# Patient Record
Sex: Female | Born: 1940 | Race: Black or African American | Hispanic: No | Marital: Married | Smoking: Never smoker
Health system: Southern US, Community
[De-identification: ages and names within clinical notes are randomized; demographics above are authoritative.]

## PROBLEM LIST (undated history)

## (undated) DIAGNOSIS — I509 Heart failure, unspecified: Secondary | ICD-10-CM

## (undated) DIAGNOSIS — I10 Essential (primary) hypertension: Secondary | ICD-10-CM

## (undated) DIAGNOSIS — E119 Type 2 diabetes mellitus without complications: Secondary | ICD-10-CM

## (undated) DIAGNOSIS — I219 Acute myocardial infarction, unspecified: Secondary | ICD-10-CM

## (undated) HISTORY — PX: ABDOMINAL HYSTERECTOMY: SHX81

---

## 1998-12-20 ENCOUNTER — Encounter: Payer: Self-pay | Admitting: Ophthalmology

## 1998-12-20 ENCOUNTER — Ambulatory Visit (HOSPITAL_COMMUNITY): Admission: RE | Admit: 1998-12-20 | Discharge: 1998-12-21 | Payer: Self-pay | Admitting: Ophthalmology

## 2006-05-27 ENCOUNTER — Emergency Department (HOSPITAL_COMMUNITY): Admission: EM | Admit: 2006-05-27 | Discharge: 2006-05-27 | Payer: Self-pay | Admitting: Emergency Medicine

## 2017-11-02 ENCOUNTER — Other Ambulatory Visit: Payer: Self-pay

## 2017-11-02 ENCOUNTER — Emergency Department (HOSPITAL_COMMUNITY): Payer: Medicare Other

## 2017-11-02 ENCOUNTER — Encounter (HOSPITAL_COMMUNITY): Payer: Self-pay

## 2017-11-02 DIAGNOSIS — Z79899 Other long term (current) drug therapy: Secondary | ICD-10-CM | POA: Insufficient documentation

## 2017-11-02 DIAGNOSIS — E119 Type 2 diabetes mellitus without complications: Secondary | ICD-10-CM | POA: Diagnosis not present

## 2017-11-02 DIAGNOSIS — I110 Hypertensive heart disease with heart failure: Secondary | ICD-10-CM | POA: Diagnosis not present

## 2017-11-02 DIAGNOSIS — R0602 Shortness of breath: Secondary | ICD-10-CM | POA: Diagnosis not present

## 2017-11-02 DIAGNOSIS — I509 Heart failure, unspecified: Secondary | ICD-10-CM | POA: Insufficient documentation

## 2017-11-02 DIAGNOSIS — Z7982 Long term (current) use of aspirin: Secondary | ICD-10-CM | POA: Insufficient documentation

## 2017-11-02 DIAGNOSIS — Z7984 Long term (current) use of oral hypoglycemic drugs: Secondary | ICD-10-CM | POA: Diagnosis not present

## 2017-11-02 LAB — BASIC METABOLIC PANEL
ANION GAP: 14 (ref 5–15)
BUN: 8 mg/dL (ref 6–20)
CO2: 26 mmol/L (ref 22–32)
Calcium: 9.6 mg/dL (ref 8.9–10.3)
Chloride: 99 mmol/L — ABNORMAL LOW (ref 101–111)
Creatinine, Ser: 0.95 mg/dL (ref 0.44–1.00)
GFR calc Af Amer: >60 mL/min (ref >60)
GFR calc non Af Amer: 58 mL/min — ABNORMAL LOW (ref >60)
GLUCOSE: 128 mg/dL — AB (ref 65–99)
POTASSIUM: 3.7 mmol/L (ref 3.5–5.1)
Sodium: 139 mmol/L (ref 135–145)

## 2017-11-02 LAB — CBC
HEMATOCRIT: 33.6 % — AB (ref 36.0–46.0)
HEMOGLOBIN: 11.3 g/dL — AB (ref 12.0–15.0)
MCH: 30.3 pg (ref 26.0–34.0)
MCHC: 33.6 g/dL (ref 30.0–36.0)
MCV: 90.1 fL (ref 78.0–100.0)
Platelets: 257 10*3/uL (ref 150–400)
RBC: 3.73 MIL/uL — ABNORMAL LOW (ref 3.87–5.11)
RDW: 13.1 % (ref 11.5–15.5)
WBC: 12 10*3/uL — ABNORMAL HIGH (ref 4.0–10.5)

## 2017-11-02 LAB — I-STAT TROPONIN, ED: Troponin i, poc: 0.05 ng/mL (ref 0.00–0.08)

## 2017-11-02 MED ORDER — IBUPROFEN 400 MG PO TABS
400.0000 mg | ORAL_TABLET | Freq: Once | ORAL | Status: AC | PRN
  Administered 2017-11-02: 400 mg via ORAL
  Filled 2017-11-02: qty 1

## 2017-11-02 NOTE — ED Triage Notes (Signed)
Per Pt, Pt is coming from home with complaints of SOB and cough that started yesterday. Denies any chest pain or congestion. No productivity with the cough.

## 2017-11-02 NOTE — ED Notes (Signed)
Pt requesting something for a headache. 

## 2017-11-03 ENCOUNTER — Emergency Department (HOSPITAL_COMMUNITY)
Admission: EM | Admit: 2017-11-03 | Discharge: 2017-11-03 | Disposition: A | Discharge status: Home / Self Care | Payer: Medicare Other | Attending: Emergency Medicine | Admitting: Emergency Medicine

## 2017-11-03 DIAGNOSIS — R0602 Shortness of breath: Secondary | ICD-10-CM

## 2017-11-03 HISTORY — DX: Type 2 diabetes mellitus without complications: E11.9

## 2017-11-03 HISTORY — DX: Acute myocardial infarction, unspecified: I21.9

## 2017-11-03 HISTORY — DX: Heart failure, unspecified: I50.9

## 2017-11-03 HISTORY — DX: Essential (primary) hypertension: I10

## 2017-11-03 LAB — I-STAT TROPONIN, ED: TROPONIN I, POC: 0.06 ng/mL (ref 0.00–0.08)

## 2017-11-03 MED ORDER — NYSTATIN 100000 UNIT/GM EX CREA: TOPICAL_CREAM | CUTANEOUS | 0 refills | Status: AC

## 2017-11-03 MED ORDER — FUROSEMIDE 20 MG PO TABS: 20.0000 mg | ORAL_TABLET | Freq: Every day | ORAL | 0 refills | Status: AC

## 2017-11-03 NOTE — Discharge Instructions (Signed)
Re-start your lasix as directed. Make sure to follow-up closely with your primary care doctor. Return to the ED for new or worsening symptoms.

## 2017-11-03 NOTE — ED Notes (Signed)
Patient ambulated hallway with no difficulty , respirations unlabored , O2 sat= 95% room air while walking .

## 2017-11-03 NOTE — ED Provider Notes (Signed)
[facility]   CSN: [id number] Arrival date & time: 11/02/17  1706     History   Chief Complaint Chief Complaint  Patient presents with  . Shortness of Breath    HPI Gloria Wilson is a 76 y.o. female.  The history is provided by the patient and medical records.  Shortness of Breath  Associated symptoms include cough.     76 y.o. F with hx of CHF, DM, HTN, prior MI, presenting to the ED for SOB.  States has been ongoing for about 2.5 days.  States she has had a dry cough but no fever, chills, sweats.  No chest pain.  States SOB is mostly with lying flat or when up and walking around.  no swelling of the legs or abdomen.  No significant weight change noted.  Patient admits she has been off of her lasix (and several of her other medications) for about a month now.  States she finished the prescription and states she was not sure she needed to continue taking it.  Daughter does report her health has declined over the past few months in general.  Patient with poor EF of 20%.  Past Medical History:  Diagnosis Date  . CHF (congestive heart failure) (HCC)   . Diabetes mellitus without complication (HCC)   . Hypertension   . MI (myocardial infarction) (HCC)     There are no active problems to display for this patient.   Past Surgical History:  Procedure Laterality Date  . ABDOMINAL HYSTERECTOMY      OB History    No data available       Home Medications    Prior to Admission medications   Medication Sig Start Date End Date Taking? Authorizing Provider  aspirin EC 81 MG tablet Take 81 mg by mouth daily.   Yes [provider]  carvedilol (COREG) 25 MG tablet Take 25 mg by mouth 2 (two) times daily. 02/24/16  Yes [provider]  cetirizine (ZYRTEC) 10 MG tablet Take 10 mg by mouth daily.   Yes [provider]  Cholecalciferol (VITAMIN D3) 1000 units CAPS Take 1,000 Units by mouth daily.   Yes  [provider]  furosemide (LASIX) 20 MG tablet Take 20 mg by mouth daily. 12/26/15  Yes [provider]  Iron-Vitamins (GERITOL) LIQD Take 15 mLs by mouth every evening.   Yes [provider]  lisinopril (PRINIVIL,ZESTRIL) 20 MG tablet Take 20 mg by mouth daily. 02/24/16  Yes [provider]  metFORMIN (GLUCOPHAGE) 500 MG tablet Take 500 mg by mouth 2 (two) times daily.   Yes [provider]  Multiple Vitamin (MULTI-VITAMINS) TABS Take 1 tablet by mouth daily.   Yes [provider]    Family History No family history on file.  Social History Social History   Tobacco Use  . Smoking status: Never Smoker  . Smokeless tobacco: Never Used  Substance Use Topics  . Alcohol use: No    Frequency: Never  . Drug use: No     Allergies   Patient has no known allergies.   Review of Systems Review of Systems  Respiratory: Positive for cough and shortness of breath.   All other systems reviewed and are negative.    Physical Exam Updated Vital Signs BP (!) 167/92 (BP Location: Right Arm)   Pulse 91   Temp 98.9 F (37.2 C) (Oral)   Resp 16   Ht 4\' 11"  (1.499 m)  Wt 72.6 kg (160 lb)   SpO2 96%   BMI 32.32 kg/m   Physical Exam  Constitutional: She is oriented to person, place, and time. She appears well-developed and well-nourished.  HENT:  Head: Normocephalic and atraumatic.  Mouth/Throat: Oropharynx is clear and moist.  Eyes: Conjunctivae and EOM are normal. Pupils are equal, round, and reactive to light.  Neck: Normal range of motion.  Cardiovascular: Normal rate, regular rhythm and normal heart sounds.  Pulmonary/Chest: Effort normal and breath sounds normal. She has no decreased breath sounds. She has no wheezes. She has no rhonchi. She has no rales.  Lungs clear, no distress, O2 sats 96%+ during exam  Abdominal: Soft. Bowel sounds are normal.  Musculoskeletal: Normal range of motion.  No significant peripheral edema    Neurological: She is alert and oriented to person, place, and time.  Skin: Skin is warm and dry.  Psychiatric: She has a normal mood and affect.  Nursing note and vitals reviewed.    ED Treatments / Results  Labs (all labs ordered are listed, but only abnormal results are displayed) Labs Reviewed  BASIC METABOLIC PANEL - Abnormal; Notable for the following components:      Result Value   Chloride 99 (*)    Glucose, Bld 128 (*)    GFR calc non Af Amer 58 (*)    All other components within normal limits  CBC - Abnormal; Notable for the following components:   WBC 12.0 (*)    RBC 3.73 (*)    Hemoglobin 11.3 (*)    HCT 33.6 (*)    All other components within normal limits  I-STAT TROPONIN, ED    EKG  EKG Interpretation  Date/Time:  Friday November 02 2017 17:11:10 EST Ventricular Rate:  104 PR Interval:  134 QRS Duration: 88 QT Interval:  364 QTC Calculation: 478 R Axis:   22 Text Interpretation:  Sinus tachycardia Biatrial enlargement Left ventricular hypertrophy Nonspecific T wave abnormality Abnormal ECG No old tracing to compare Confirmed by Ward, Baxter HireKristen [phone]([id number]) on 11/03/2017 3:12:03 AM       Radiology Dg Chest 2 View  Result Date: 11/02/2017 CLINICAL DATA:  Shortness of breath EXAM: CHEST  2 VIEW COMPARISON:  02/03/2016 FINDINGS: Cardiomegaly with mild central congestion. No large effusion. Subsegmental atelectasis or scarring in the left mid lung. No pneumothorax. Mild wedging of midthoracic vertebra unchanged. Aortic atherosclerosis. IMPRESSION: Cardiomegaly with mild central congestion. Stable linear opacity in the left mid lung, which may reflect subsegmental atelectasis or scarring Electronically Signed   By: Jasmine PangKim  Fujinaga M.D.   On: 11/02/2017 18:32    Procedures Procedures (including critical care time)  Medications Ordered in ED Medications  ibuprofen (ADVIL,MOTRIN) tablet 400 mg (400 mg Oral Given 11/02/17 2358)     Initial Impression / Assessment  and Plan / ED Course  I have reviewed the triage vital signs and the nursing notes.  Pertinent labs & imaging results that were available during my care of the patient were reviewed by me and considered in my medical decision making (see chart for details).  76 y.o. F here with SOB x2 days.  No chest pain.  Slight cough without fever/chills.  Lungs clear on exam.  O2 sats 96% or higher during exam.  No distress noted.  No significant peripheral edema.  Screening labs overall reassuring.  CXR with vascular congestion.  She has very poor EF of 20%.  Has been off lasix for at least a month (states she was not  aware she needed to refill it/keep taking it).  Reports SOB mostly when lying flat and during ambulation.  Suspect she may have component of mild CHF.  Will ambulate with pulse ox, repeat troponin.  Patient ambulated, maintained sats 95% or better.  Delta troponin is negative.  Feel she is stable for discharge.  Given congestion on her CXR and poor EF, will re-start her lasix.  Close follow-up with PCP/cardiologist.  Discussed plan with patient and family, they acknowledged understanding and agreed with plan of care.  Return precautions given for new or worsening symptoms.  Patient seen and evaluated with attending physician, Dr. Elesa Massed, who agrees with assessment and plan of care.  Final Clinical Impressions(s) / ED Diagnoses   Final diagnoses:  Shortness of breath    ED Discharge Orders        Ordered    furosemide (LASIX) 20 MG tablet  Daily     11/03/17 0458       Garlon Hatchet, PA-C 11/03/17 0506    Ward, Layla Maw, DO 11/03/17 [phone]

## 2017-11-03 NOTE — ED Notes (Signed)
Dr. Elesa MassedWard ( EDP) explained tests results and plan of care to pt. And family . Denies chest pain /respirations unlabored , IV site intact .

## 2018-05-03 IMAGING — DX DG CHEST 2V
2 series · 2 of 2 positions shown · non-contrast
Comparison: 02/03/2016

CLINICAL DATA: Shortness of breath

EXAM:
CHEST  2 VIEW

[chest pa]
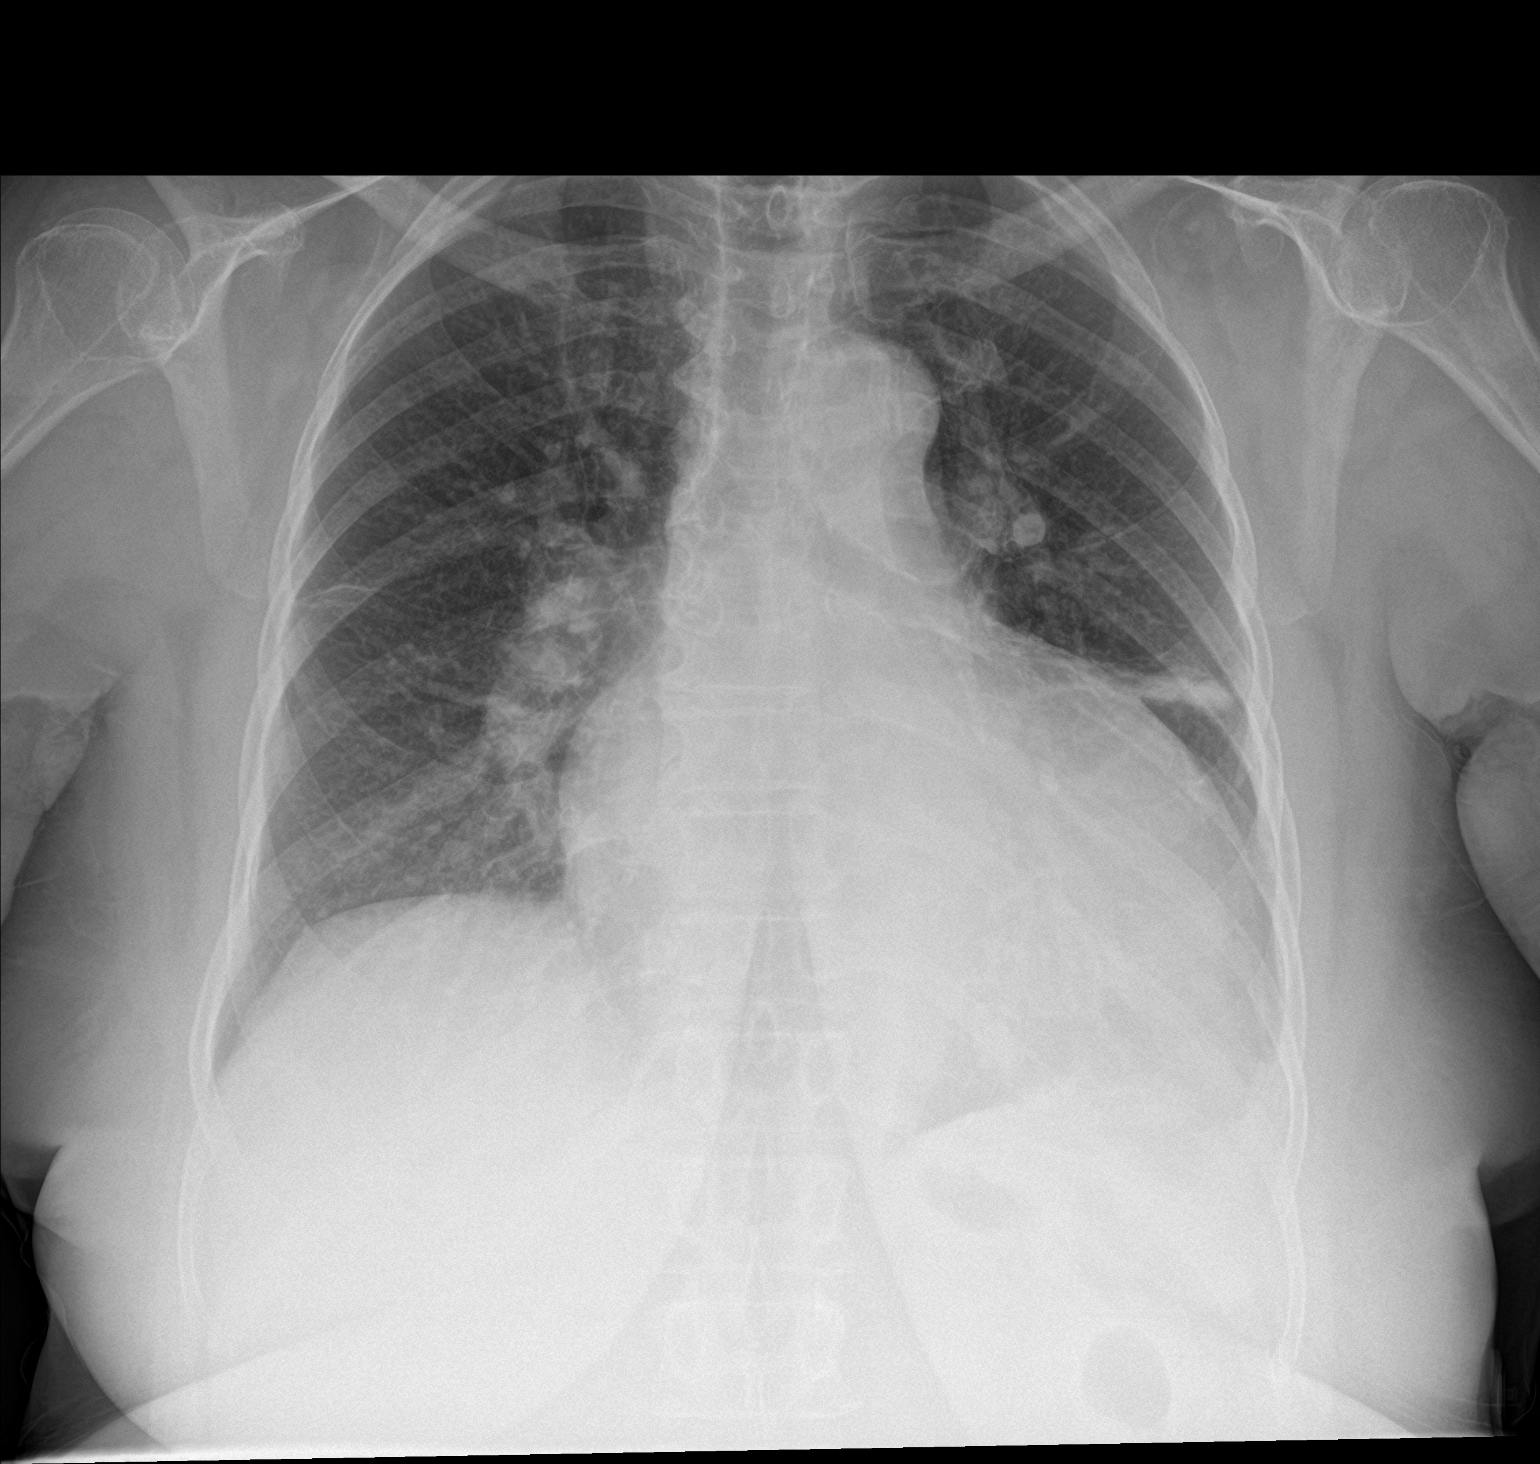

[chest lat]
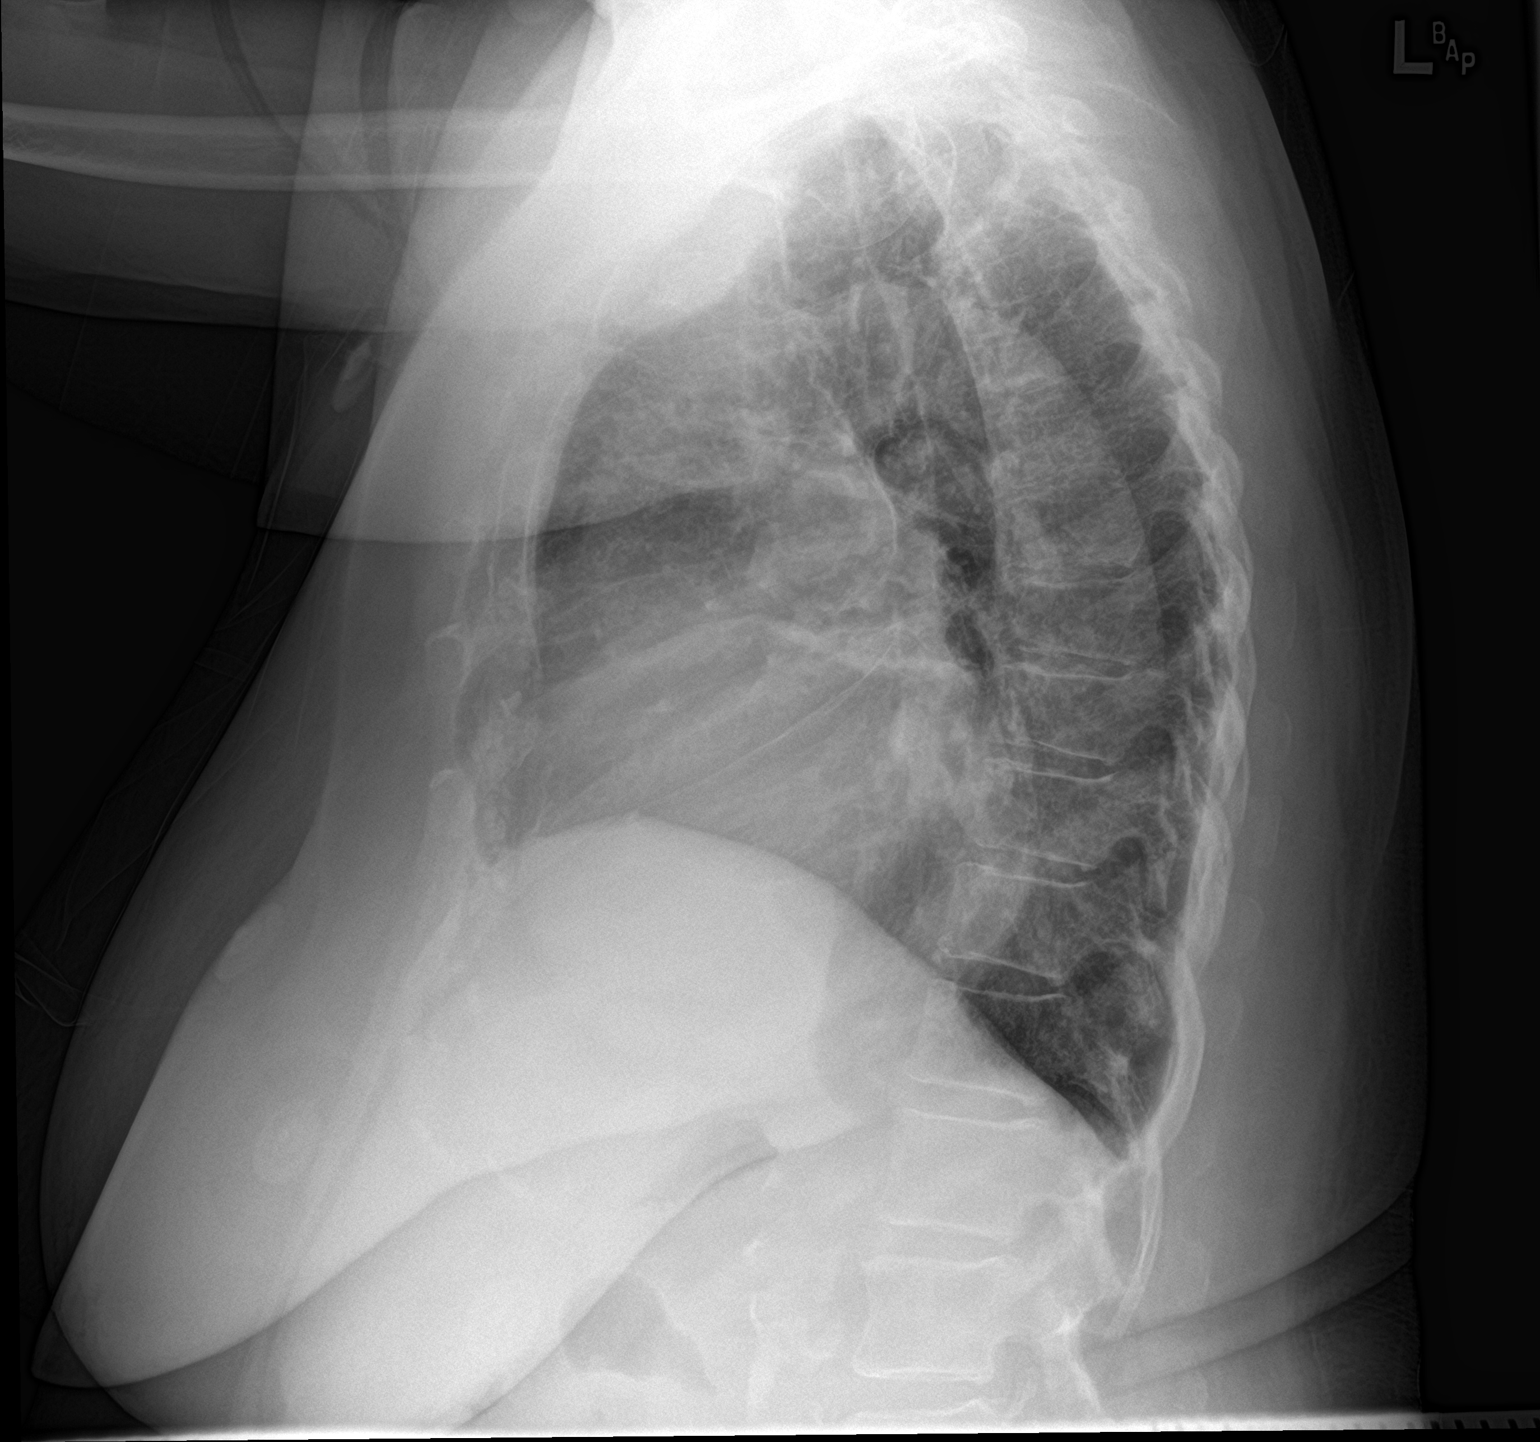

[2 of 2 positions shown; findings below may reference images not displayed]

FINDINGS: Cardiomegaly with mild central congestion. No large effusion.
Subsegmental atelectasis or scarring in the left mid lung. No
pneumothorax. Mild wedging of midthoracic vertebra unchanged. Aortic
atherosclerosis.
IMPRESSION: Cardiomegaly with mild central congestion. Stable linear opacity in
the left mid lung, which may reflect subsegmental atelectasis or
scarring

## 2018-07-07 DEATH — deceased
